# Patient Record
Sex: Female | Born: 1960
Health system: Southern US, Community
[De-identification: ages and names within clinical notes are randomized; demographics above are authoritative.]

---

## 2001-05-29 ENCOUNTER — Emergency Department (HOSPITAL_COMMUNITY): Admission: EM | Admit: 2001-05-29 | Discharge: 2001-05-29 | Payer: Self-pay | Admitting: Emergency Medicine

## 2002-11-29 ENCOUNTER — Encounter: Payer: Self-pay | Admitting: Family Medicine

## 2002-11-29 ENCOUNTER — Ambulatory Visit (HOSPITAL_COMMUNITY): Admission: RE | Admit: 2002-11-29 | Discharge: 2002-11-29 | Payer: Self-pay | Admitting: Family Medicine

## 2003-05-01 ENCOUNTER — Other Ambulatory Visit: Admission: RE | Admit: 2003-05-01 | Discharge: 2003-05-01 | Payer: Self-pay | Admitting: Obstetrics and Gynecology

## 2008-06-18 ENCOUNTER — Ambulatory Visit (HOSPITAL_COMMUNITY): Admission: RE | Admit: 2008-06-18 | Discharge: 2008-06-18 | Payer: Self-pay | Admitting: Family Medicine

## 2011-01-03 ENCOUNTER — Encounter: Payer: Self-pay | Admitting: Neurology

## 2014-09-21 ENCOUNTER — Emergency Department (HOSPITAL_COMMUNITY)
Admission: EM | Admit: 2014-09-21 | Discharge: 2014-09-21 | Disposition: A | Discharge status: Home / Self Care | Payer: BC Managed Care – PPO | Attending: Emergency Medicine | Admitting: Emergency Medicine

## 2014-09-21 ENCOUNTER — Encounter (HOSPITAL_COMMUNITY): Payer: Self-pay | Admitting: Emergency Medicine

## 2014-09-21 DIAGNOSIS — S61452A Open bite of left hand, initial encounter: Secondary | ICD-10-CM | POA: Insufficient documentation

## 2014-09-21 DIAGNOSIS — F419 Anxiety disorder, unspecified: Secondary | ICD-10-CM | POA: Diagnosis not present

## 2014-09-21 DIAGNOSIS — W5501XA Bitten by cat, initial encounter: Secondary | ICD-10-CM | POA: Insufficient documentation

## 2014-09-21 DIAGNOSIS — Z792 Long term (current) use of antibiotics: Secondary | ICD-10-CM | POA: Diagnosis not present

## 2014-09-21 DIAGNOSIS — Y9389 Activity, other specified: Secondary | ICD-10-CM | POA: Diagnosis not present

## 2014-09-21 DIAGNOSIS — Z720 Tobacco use: Secondary | ICD-10-CM | POA: Insufficient documentation

## 2014-09-21 DIAGNOSIS — Z23 Encounter for immunization: Secondary | ICD-10-CM | POA: Insufficient documentation

## 2014-09-21 DIAGNOSIS — Y9289 Other specified places as the place of occurrence of the external cause: Secondary | ICD-10-CM | POA: Insufficient documentation

## 2014-09-21 MED ORDER — TETANUS-DIPHTH-ACELL PERTUSSIS 5-2.5-18.5 LF-MCG/0.5 IM SUSP
0.5000 mL | Freq: Once | INTRAMUSCULAR | Status: AC
  Administered 2014-09-21: 0.5 mL via INTRAMUSCULAR
  Filled 2014-09-21: qty 0.5

## 2014-09-21 MED ORDER — HYDROCODONE-ACETAMINOPHEN 5-325 MG PO TABS
1.0000 | ORAL_TABLET | Freq: Once | ORAL | Status: AC
  Administered 2014-09-21: 1 via ORAL
  Filled 2014-09-21: qty 1

## 2014-09-21 MED ORDER — AMOXICILLIN-POT CLAVULANATE 875-125 MG PO TABS: 1.0000 | ORAL_TABLET | Freq: Two times a day (BID) | ORAL | Status: AC

## 2014-09-21 MED ORDER — AMOXICILLIN-POT CLAVULANATE 875-125 MG PO TABS
1.0000 | ORAL_TABLET | Freq: Once | ORAL | Status: AC
  Administered 2014-09-21: 1 via ORAL
  Filled 2014-09-21: qty 1

## 2014-09-21 MED ORDER — HYDROCODONE-ACETAMINOPHEN 5-325 MG PO TABS: 1.0000 | ORAL_TABLET | Freq: Four times a day (QID) | ORAL | Status: AC | PRN

## 2014-09-21 MED ORDER — KETOROLAC TROMETHAMINE 30 MG/ML IJ SOLN: 60.0000 mg | Freq: Once | INTRAMUSCULAR | Status: DC

## 2014-09-21 NOTE — Discharge Instructions (Signed)

## 2014-09-21 NOTE — ED Provider Notes (Signed)
CSN: [id number]     Arrival date & time 09/21/14  0527 History   First MD Initiated Contact with Patient 09/21/14 0555     Chief Complaint  Patient presents with  . Animal Bite     (Consider location/radiation/quality/duration/timing/severity/associated sxs/prior Treatment) HPI  This is a 53 year old female who presents following a cat bite. Patient reports that she was bitten by her cat approximately one hour prior to arrival. She was bitten on her left hand. Bite was unprovoked. Cat is up-to-date on immunizations. Patient reports that she was previously her cat and developed soft tissue infection.  She is scared that that may happen again. She is not up-to-date on her tetanus shot.  History reviewed. No pertinent past medical history. History reviewed. No pertinent past surgical history. No family history on file. History  Substance Use Topics  . Smoking status: Current Every Day Smoker  . Smokeless tobacco: Not on file  . Alcohol Use: Yes   OB History   Grav Para Term Preterm Abortions TAB SAB Ect Mult Living                 Review of Systems  Musculoskeletal:       Hand pain  Skin: Positive for wound.  Psychiatric/Behavioral: The patient is nervous/anxious.   All other systems reviewed and are negative.     Allergies  Review of patient's allergies indicates no known allergies.  Home Medications   Prior to Admission medications   Medication Sig Start Date End Date Taking? Authorizing Provider  amoxicillin-clavulanate (AUGMENTIN) 875-125 MG per tablet Take 1 tablet by mouth 2 (two) times daily. 09/21/14   Shon Batonourtney F Amaya Blakeman, MD  HYDROcodone-acetaminophen (NORCO/VICODIN) 5-325 MG per tablet Take 1 tablet by mouth every 6 (six) hours as needed for moderate pain. 09/21/14   Shon Batonourtney F Johnathan Tortorelli, MD   BP 127/90  Pulse 79  Temp(Src) 97.5 F (36.4 C) (Oral)  Resp 16  Ht 5\' 6"  (1.676 m)  Wt 125 lb (56.7 kg)  BMI 20.19 kg/m2  SpO2 98% Physical Exam  Nursing note and  vitals reviewed. Constitutional: She is oriented to person, place, and time. She appears well-developed and well-nourished.  Anxious appearing  HENT:  Head: Normocephalic and atraumatic.  Cardiovascular: Normal rate and regular rhythm.   Pulmonary/Chest: Effort normal. No respiratory distress.  Musculoskeletal:  Focused examination of the left hand reveals a small puncture wound over the hyperthenar eminence, mild swelling noted, no erythema, additionally there is a puncture mark over the fifth digit of the left hand on the dorsal aspect, again no erythema associated. Patient has full range of motion of the wrist and fingers, 2+ radial pulse  Neurological: She is alert and oriented to person, place, and time.  Skin: Skin is warm and dry.  Psychiatric: She has a normal mood and affect.    ED Course  Procedures (including critical care time) Labs Review Labs Reviewed - No data to display  Imaging Review No results found.   EKG Interpretation None      MDM   Final diagnoses:  Cat bite of hand, left, initial encounter    Patient presents with a cat bite to her left hand. Occurred one hour prior to arrival. Patient is very anxious appearing and reports that she is fearful that she will require IV antibiotics like she did last time. Currently, there is no evidence of erythema associated with the bite. She has good range of motion of the hand. She does have mild swelling  but this is localized. Discussed with patient starting on empiric Augmentin. When discussing tetanus prophylaxis, patient became very tearful procedure fearful of needles. Patient was initially hesitant but then agreed. 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 No indication at this time for rabies prophylaxis.  Patient will followup with her primary physician. She was given strict return cautions. She has any new worsening symptoms she should return for further evaluation.  After history, exam, and  medical workup I feel the patient has been appropriately medically screened and is safe for discharge home. Pertinent diagnoses were discussed with the patient. Patient was given return precautions.     Shon Batonourtney F Brandom Kerwin, MD 09/21/14 [phone]

## 2014-09-21 NOTE — ED Notes (Signed)
Pt states she was attacked by her cat about an hour ago.

## 2017-11-22 DIAGNOSIS — J069 Acute upper respiratory infection, unspecified: Secondary | ICD-10-CM | POA: Diagnosis not present

## 2018-05-17 DIAGNOSIS — R8291 Other chromoabnormalities of urine: Secondary | ICD-10-CM | POA: Diagnosis not present

## 2018-05-17 DIAGNOSIS — Z6826 Body mass index (BMI) 26.0-26.9, adult: Secondary | ICD-10-CM | POA: Diagnosis not present

## 2018-05-17 DIAGNOSIS — N390 Urinary tract infection, site not specified: Secondary | ICD-10-CM | POA: Diagnosis not present

## 2019-06-12 DIAGNOSIS — L03818 Cellulitis of other sites: Secondary | ICD-10-CM | POA: Diagnosis not present

## 2019-06-12 DIAGNOSIS — W540XXA Bitten by dog, initial encounter: Secondary | ICD-10-CM | POA: Diagnosis not present

## 2021-03-27 ENCOUNTER — Encounter (HOSPITAL_COMMUNITY): Payer: Self-pay

## 2021-03-27 ENCOUNTER — Emergency Department (HOSPITAL_COMMUNITY)
Admission: EM | Admit: 2021-03-27 | Discharge: 2021-03-27 | Disposition: A | Discharge status: Home / Self Care | Payer: BC Managed Care – PPO | Attending: Emergency Medicine | Admitting: Emergency Medicine

## 2021-03-27 ENCOUNTER — Other Ambulatory Visit: Payer: Self-pay

## 2021-03-27 ENCOUNTER — Emergency Department (HOSPITAL_COMMUNITY): Payer: BC Managed Care – PPO

## 2021-03-27 DIAGNOSIS — S0101XA Laceration without foreign body of scalp, initial encounter: Secondary | ICD-10-CM

## 2021-03-27 DIAGNOSIS — F17200 Nicotine dependence, unspecified, uncomplicated: Secondary | ICD-10-CM | POA: Diagnosis not present

## 2021-03-27 DIAGNOSIS — W01198A Fall on same level from slipping, tripping and stumbling with subsequent striking against other object, initial encounter: Secondary | ICD-10-CM | POA: Diagnosis not present

## 2021-03-27 DIAGNOSIS — G319 Degenerative disease of nervous system, unspecified: Secondary | ICD-10-CM | POA: Diagnosis not present

## 2021-03-27 DIAGNOSIS — Y93E8 Activity, other personal hygiene: Secondary | ICD-10-CM | POA: Diagnosis not present

## 2021-03-27 DIAGNOSIS — S0990XA Unspecified injury of head, initial encounter: Secondary | ICD-10-CM | POA: Diagnosis not present

## 2021-03-27 DIAGNOSIS — G9389 Other specified disorders of brain: Secondary | ICD-10-CM | POA: Diagnosis not present

## 2021-03-27 DIAGNOSIS — Y92002 Bathroom of unspecified non-institutional (private) residence single-family (private) house as the place of occurrence of the external cause: Secondary | ICD-10-CM | POA: Insufficient documentation

## 2021-03-27 DIAGNOSIS — I639 Cerebral infarction, unspecified: Secondary | ICD-10-CM | POA: Diagnosis not present

## 2021-03-27 MED ORDER — LIDOCAINE-EPINEPHRINE (PF) 1 %-1:200000 IJ SOLN
10.0000 mL | Freq: Once | INTRAMUSCULAR | Status: AC
  Administered 2021-03-27: 10 mL
  Filled 2021-03-27: qty 30

## 2021-03-27 NOTE — ED Notes (Addendum)
Dr Oletta Cohn at bedside for [facility], no need for pt to sign waiver

## 2021-03-27 NOTE — Discharge Instructions (Addendum)
You need to have your staples removed in 10 days.  Please go to urgent care for removal.

## 2021-03-27 NOTE — ED Provider Notes (Signed)
[facility] EMERGENCY DEPARTMENT Provider Note   CSN: [id number] Arrival date & time: 03/27/21  0034     History Chief Complaint  Patient presents with  . Fall    Scalp laceration    Julia Allen is a 60 y.o. female.  Patient presents for evaluation of head injury.  Patient reports that she was in her bathroom trying to remove nail polish from her toes when she lost balance and fell.  Husband heard her fall, ran to the bathroom.  She was either dazed or knocked out for 20 or 30 seconds.  Patient complains of headache, no neck or back pain.  Tetanus is up-to-date.        History reviewed. No pertinent past medical history.  There are no problems to display for this patient.   History reviewed. No pertinent surgical history.   OB History   No obstetric history on file.     No family history on file.  Social History   Tobacco Use  . Smoking status: Current Every Day Smoker  Substance Use Topics  . Alcohol use: Yes  . Drug use: No    Home Medications Prior to Admission medications   Medication Sig Start Date End Date Taking? Authorizing Provider  amoxicillin-clavulanate (AUGMENTIN) 875-125 MG per tablet Take 1 tablet by mouth 2 (two) times daily. 09/21/14   Horton, Mayer Masker, MD  HYDROcodone-acetaminophen (NORCO/VICODIN) 5-325 MG per tablet Take 1 tablet by mouth every 6 (six) hours as needed for moderate pain. 09/21/14   Horton, Mayer Masker, MD    Allergies    Patient has no known allergies.  Review of Systems   Review of Systems  Skin: Positive for wound.  Neurological: Positive for headaches.    Physical Exam Updated Vital Signs BP 118/88 (BP Location: Left Arm)   Pulse 65   Temp 97.6 F (36.4 C) (Oral)   Resp 18   Ht 5\' 5"  (1.651 m)   Wt 56.7 kg   SpO2 95%   BMI 20.80 kg/m   Physical Exam Vitals and nursing note reviewed.  Constitutional:      Appearance: Normal appearance.  HENT:     Head: Laceration (posterior scalp) present.  Eyes:      Extraocular Movements: Extraocular movements intact.     Pupils: Pupils are equal, round, and reactive to light.  Cardiovascular:     Rate and Rhythm: Normal rate.  Pulmonary:     Effort: Pulmonary effort is normal.     Breath sounds: Normal breath sounds.  Musculoskeletal:        General: No deformity. Normal range of motion.     Cervical back: Normal range of motion and neck supple. No tenderness.  Skin:    Findings: Laceration (3cm post scalp) present.  Neurological:     Mental Status: She is alert and oriented to person, place, and time.     GCS: GCS eye subscore is 4. GCS verbal subscore is 5. GCS motor subscore is 6.     Cranial Nerves: Cranial nerves are intact.     Sensory: Sensation is intact.     Motor: Motor function is intact.     ED Results / Procedures / Treatments   Labs (all labs ordered are listed, but only abnormal results are displayed) Labs Reviewed - No data to display  EKG None  Radiology CT HEAD WO CONTRAST  Result Date: 03/27/2021 CLINICAL DATA:  Status post fall. EXAM: CT HEAD WITHOUT CONTRAST TECHNIQUE: Contiguous axial images  were obtained from the base of the skull through the vertex without intravenous contrast. COMPARISON:  None. FINDINGS: Brain: There is mild cerebral atrophy with widening of the extra-axial spaces and ventricular dilatation. There are areas of decreased attenuation within the white matter tracts of the supratentorial brain, consistent with microvascular disease changes. A small chronic infarct is seen along the posterolateral aspect of the pons on the left. Vascular: No hyperdense vessel or unexpected calcification. Skull: Normal. Negative for fracture or focal lesion. Sinuses/Orbits: No acute finding. Other: None. IMPRESSION: 1. Mild cerebral atrophy. 2. Small chronic left pontine infarct. 3. No acute intracranial abnormality. Electronically Signed   By: Aram Candela M.D.   On: 03/27/2021 01:46    Procedures .Marland KitchenLaceration  Repair  Date/Time: 03/27/2021 2:47 AM Performed by: Gilda Crease, MD Authorized by: Gilda Crease, MD   Consent:    Consent obtained:  Verbal   Consent given by:  Patient   Risks, benefits, and alternatives were discussed: yes     Risks discussed:  Infection and pain Universal protocol:    Procedure explained and questions answered to patient or proxy's satisfaction: yes     Relevant documents present and verified: yes     Test results available: yes     Imaging studies available: yes     Required blood products, implants, devices, and special equipment available: yes     Site/side marked: yes     Immediately prior to procedure, a time out was called: yes     Patient identity confirmed:  Verbally with patient Anesthesia:    Anesthesia method:  Local infiltration   Local anesthetic:  Lidocaine 1% WITH epi Laceration details:    Location:  Scalp   Scalp location:  Occipital   Length (cm):  3 Pre-procedure details:    Preparation:  Patient was prepped and draped in usual sterile fashion and imaging obtained to evaluate for foreign bodies Exploration:    Limited defect created (wound extended): yes     Hemostasis achieved with:  Epinephrine   Contaminated: no   Treatment:    Area cleansed with:  Shur-Clens   Irrigation solution:  Sterile saline   Irrigation volume:  500   Irrigation method:  Syringe   Debridement:  None Skin repair:    Repair method:  Staples   Number of staples:  6 Approximation:    Approximation:  Close Repair type:    Repair type:  Simple Post-procedure details:    Dressing:  Open (no dressing)   Procedure completion:  Tolerated well, no immediate complications     Medications Ordered in ED Medications  lidocaine-EPINEPHrine (XYLOCAINE-EPINEPHrine) 1 %-1:200000 (PF) injection 10 mL (has no administration in time range)    ED Course  I have reviewed the triage vital signs and the nursing notes.  Pertinent labs & imaging  results that were available during my care of the patient were reviewed by me and considered in my medical decision making (see chart for details).    MDM Rules/Calculators/A&P                          Patient presents with head injury after a fall.  Patient lost her balance causing her to fall.  She has a mild headache and a laceration on her scalp.  CT head does not show any intracranial injury.  Remainder of exam is unremarkable.  Neurologic exam is normal.  Scalp laceration repaired with staples.  Staple removal  in 10 days.  Final Clinical Impression(s) / ED Diagnoses Final diagnoses:  Laceration of scalp, initial encounter    Rx / DC Orders ED Discharge Orders    None       Oretha Weismann, Canary Brim, MD 03/27/21 [phone]

## 2021-03-27 NOTE — ED Triage Notes (Signed)
Pt fell in bathroom tonight hitting head on bathroom door, laceration noted to back of scalp. Pt reports LOC for about 30 seconds per spouse.

## 2021-04-06 DIAGNOSIS — Z1211 Encounter for screening for malignant neoplasm of colon: Secondary | ICD-10-CM | POA: Diagnosis not present

## 2021-04-06 DIAGNOSIS — Z1231 Encounter for screening mammogram for malignant neoplasm of breast: Secondary | ICD-10-CM | POA: Diagnosis not present

## 2021-04-06 DIAGNOSIS — S0101XD Laceration without foreign body of scalp, subsequent encounter: Secondary | ICD-10-CM | POA: Diagnosis not present

## 2021-05-29 DIAGNOSIS — A020 Salmonella enteritis: Secondary | ICD-10-CM | POA: Diagnosis not present

## 2021-05-29 DIAGNOSIS — R197 Diarrhea, unspecified: Secondary | ICD-10-CM | POA: Diagnosis not present

## 2021-09-26 IMAGING — CT CT HEAD W/O CM
3 series · 16 of 47 positions shown, 19 images · non-contrast
Comparison: None.

CLINICAL DATA: Status post fall.

EXAM:
CT HEAD WITHOUT CONTRAST
TECHNIQUE: Contiguous axial images were obtained from the base of the skull
through the vertex without intravenous contrast.

[Series 2: head w o · axial · 0.39mm/px · z∈[+1631,+1761]mm · 10 of 32 slices shown, 13 images]
[im 3/32  brain]
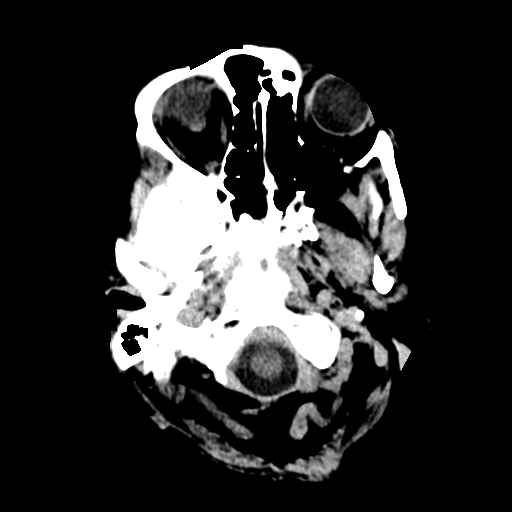
[im 3/32  bone]
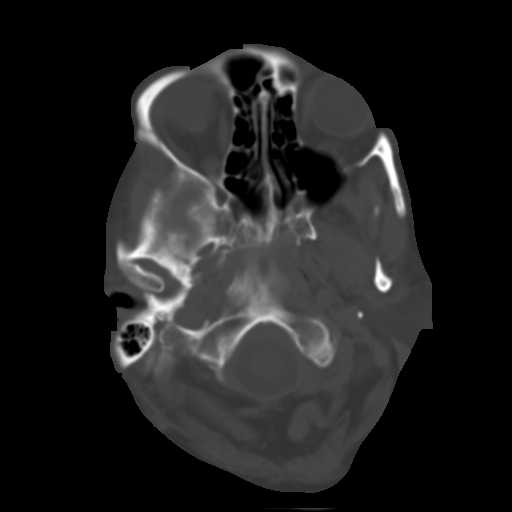
[im 6/32  brain]
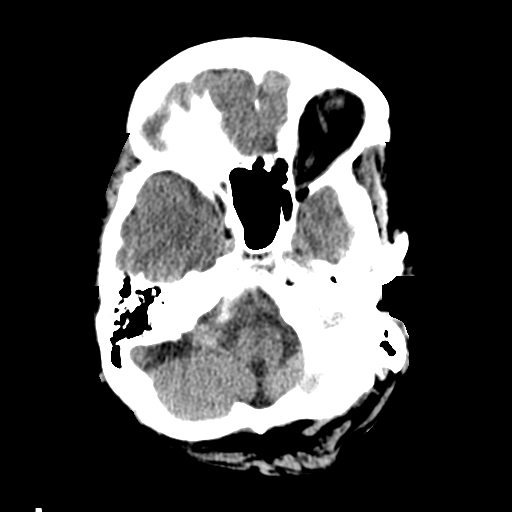
[im 9/32  brain]
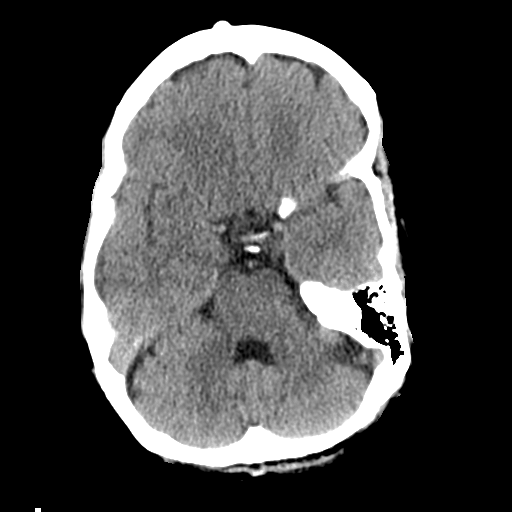
[im 11/32  brain]
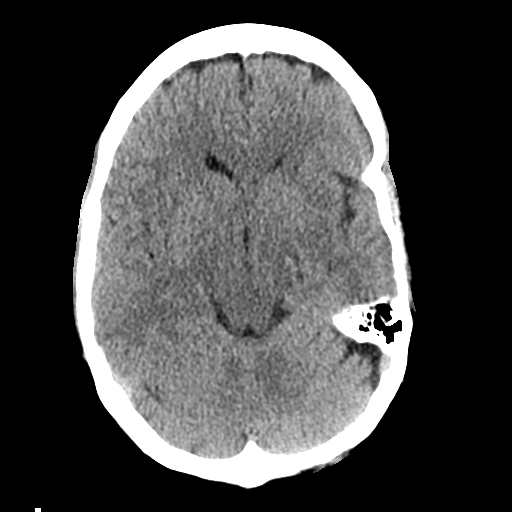
[im 14/32  brain]
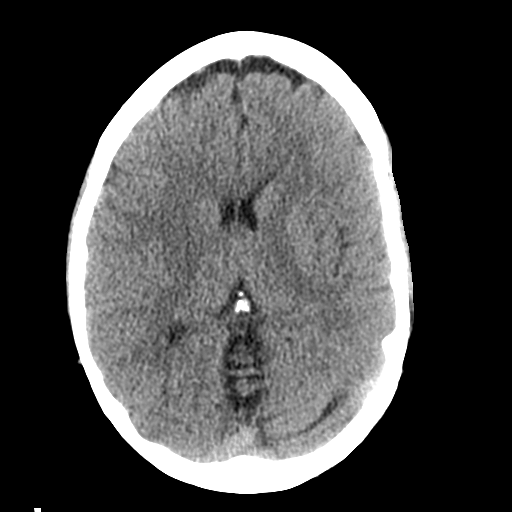
[im 14/32  bone]
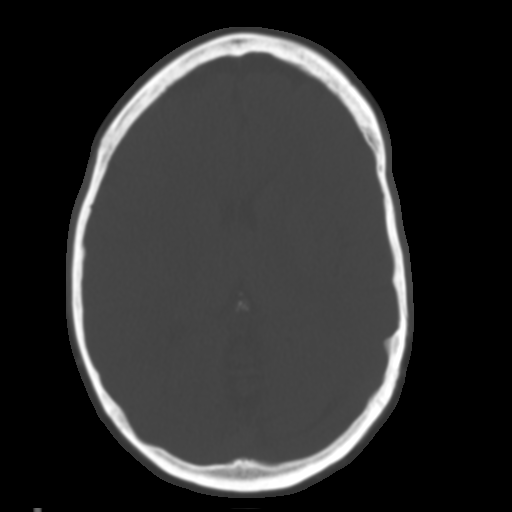
[im 18/32  brain]
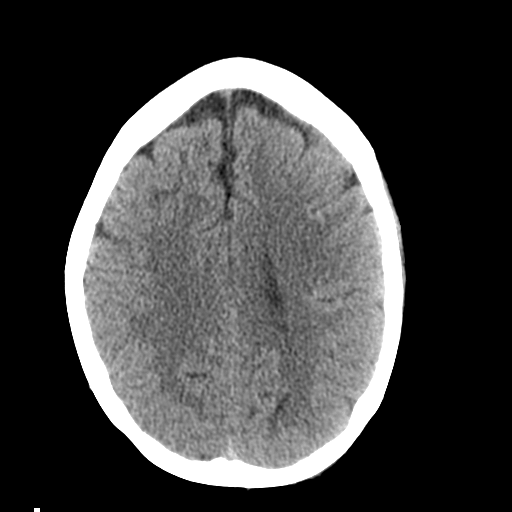
[im 21/32  brain]
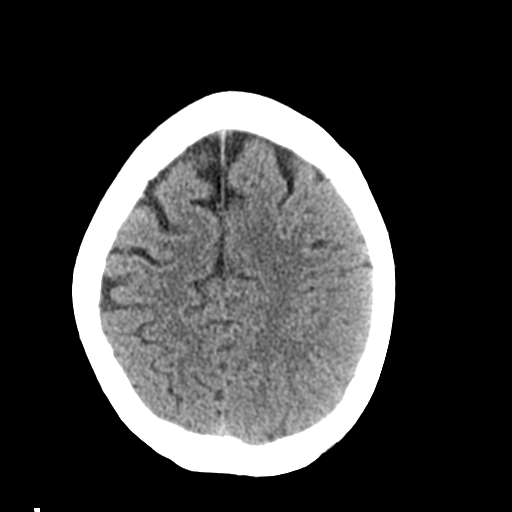
[im 24/32  brain]
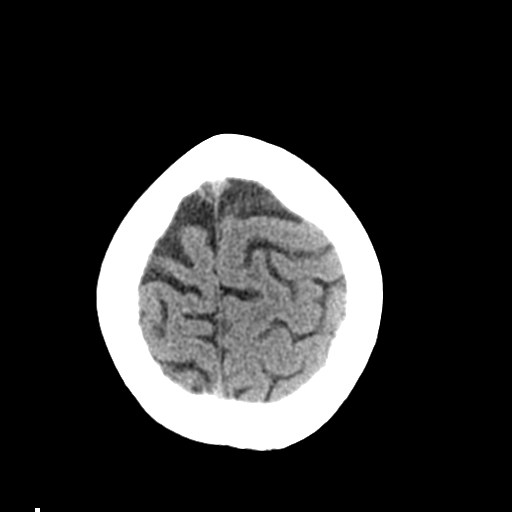
[im 26/32  brain]
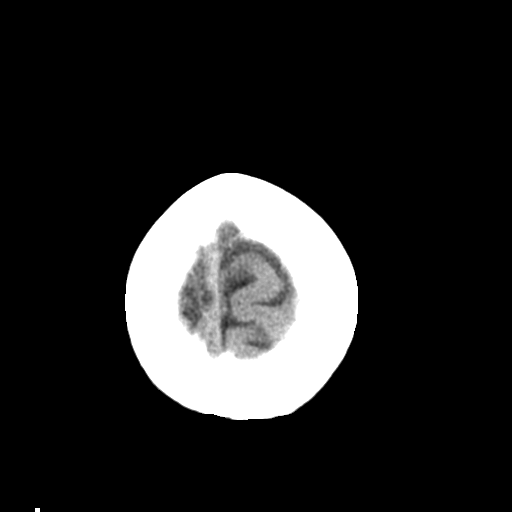
[im 26/32  bone]
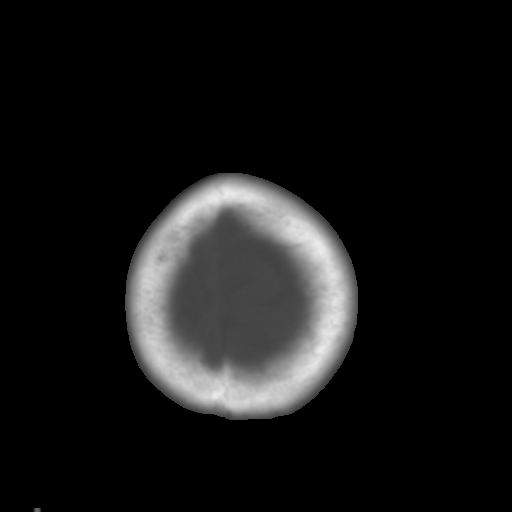
[im 29/32  brain]
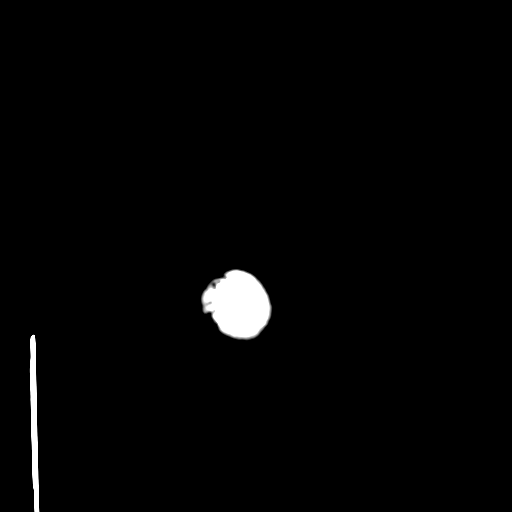

[Series 4: coronal soft · coronal · 0.31mm/px · 3 of 65 slices shown]
[im 22/65  brain]
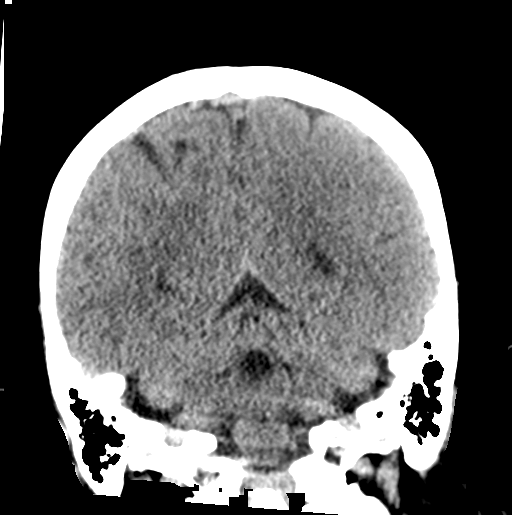
[im 29/65  brain]
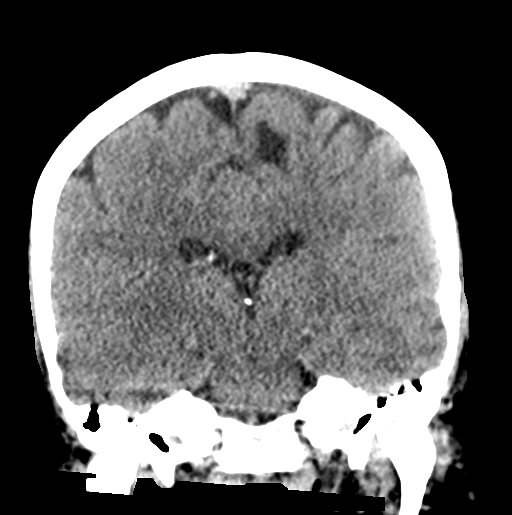
[im 36/65  brain]
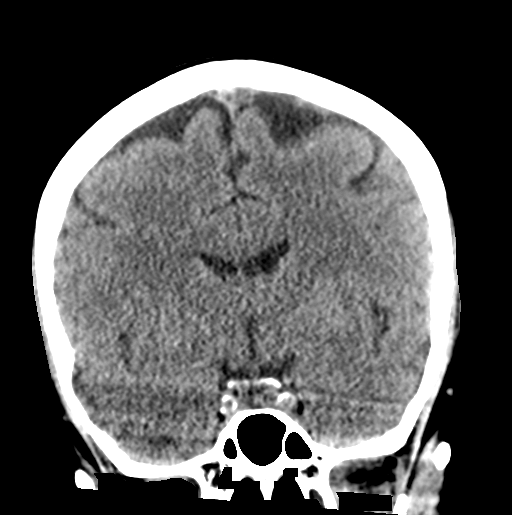

[Series 5: sagittal soft · sagittal · 0.31mm/px · 3 of 47 slices shown]
[im 16/47  brain]
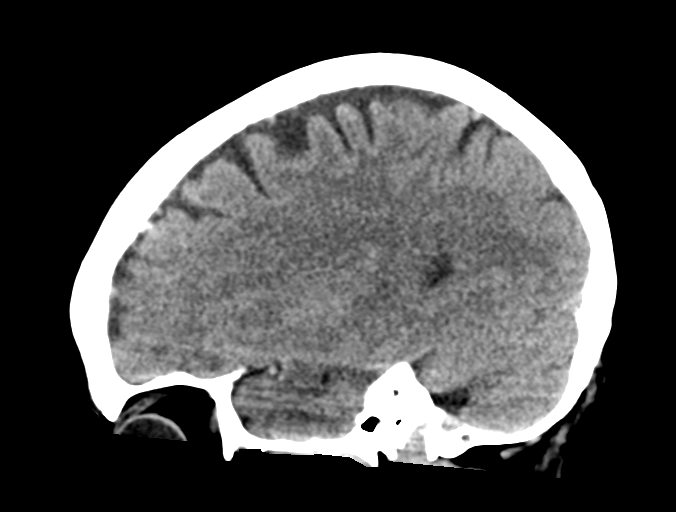
[im 24/47  brain]
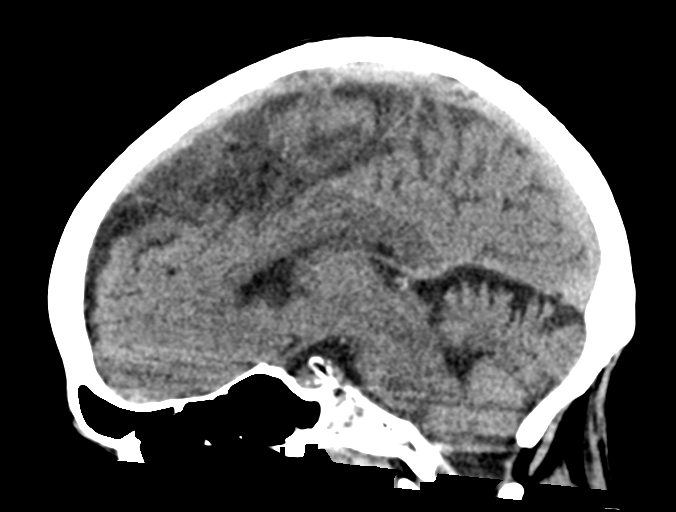
[im 31/47  brain]
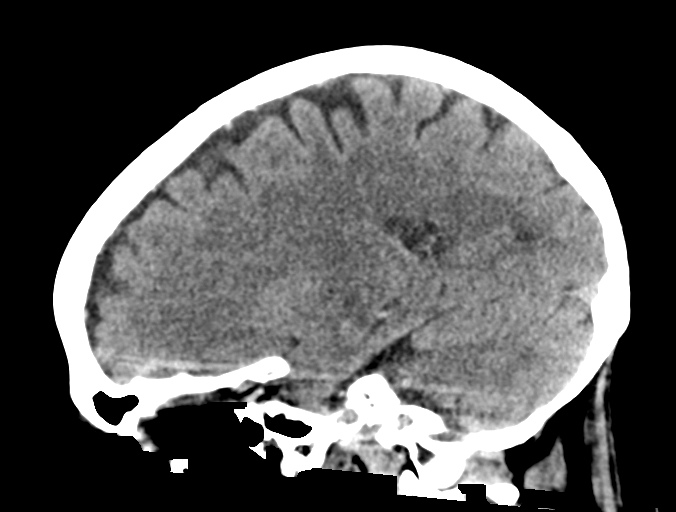

[16 of 47 positions shown; findings below may reference images not displayed]

FINDINGS: Brain: There is mild cerebral atrophy with widening of the
extra-axial spaces and ventricular dilatation.
There are areas of decreased attenuation within the white matter
tracts of the supratentorial brain, consistent with microvascular
disease changes.

A small chronic infarct is seen along the posterolateral aspect of
the pons on the left.

Vascular: No hyperdense vessel or unexpected calcification.

Skull: Normal. Negative for fracture or focal lesion.

Sinuses/Orbits: No acute finding.

Other: None.
IMPRESSION: 1. Mild cerebral atrophy.
2. Small chronic left pontine infarct.
3. No acute intracranial abnormality.

## 2022-05-18 ENCOUNTER — Other Ambulatory Visit (HOSPITAL_COMMUNITY): Payer: Self-pay | Admitting: Adult Health Nurse Practitioner

## 2022-05-18 DIAGNOSIS — Z1231 Encounter for screening mammogram for malignant neoplasm of breast: Secondary | ICD-10-CM

## 2022-08-03 ENCOUNTER — Ambulatory Visit (HOSPITAL_COMMUNITY)
Admission: RE | Admit: 2022-08-03 | Discharge: 2022-08-03 | Disposition: A | Discharge status: Home / Self Care | Payer: BC Managed Care – PPO | Source: Ambulatory Visit | Attending: Family Medicine | Admitting: Family Medicine

## 2022-08-03 ENCOUNTER — Other Ambulatory Visit (HOSPITAL_COMMUNITY): Payer: Self-pay | Admitting: Family Medicine

## 2022-08-03 DIAGNOSIS — M25562 Pain in left knee: Secondary | ICD-10-CM | POA: Insufficient documentation

## 2023-06-01 ENCOUNTER — Other Ambulatory Visit (HOSPITAL_COMMUNITY): Payer: Self-pay | Admitting: Adult Health Nurse Practitioner

## 2023-06-01 DIAGNOSIS — Z1231 Encounter for screening mammogram for malignant neoplasm of breast: Secondary | ICD-10-CM
